# Patient Record
Sex: Male | Born: 1960 | Marital: Married | Smoking: Current some day smoker
Health system: Southern US, Community
[De-identification: ages and names within clinical notes are randomized; demographics above are authoritative.]

## PROBLEM LIST (undated history)

## (undated) DIAGNOSIS — E785 Hyperlipidemia, unspecified: Secondary | ICD-10-CM

## (undated) DIAGNOSIS — M109 Gout, unspecified: Secondary | ICD-10-CM

## (undated) HISTORY — PX: OTHER SURGICAL HISTORY: SHX169

## (undated) HISTORY — DX: Hyperlipidemia, unspecified: E78.5

## (undated) HISTORY — DX: Gout, unspecified: M10.9

---

## 2011-05-17 ENCOUNTER — Emergency Department: Payer: Self-pay | Admitting: Unknown Physician Specialty

## 2013-01-31 ENCOUNTER — Ambulatory Visit: Payer: Self-pay

## 2013-12-12 ENCOUNTER — Ambulatory Visit: Payer: Self-pay

## 2015-01-02 ENCOUNTER — Other Ambulatory Visit: Payer: Self-pay | Admitting: Neurosurgery

## 2015-01-06 ENCOUNTER — Other Ambulatory Visit: Payer: Self-pay | Admitting: Neurosurgery

## 2015-01-06 DIAGNOSIS — M5412 Radiculopathy, cervical region: Secondary | ICD-10-CM

## 2015-01-13 ENCOUNTER — Ambulatory Visit
Admission: RE | Admit: 2015-01-13 | Discharge: 2015-01-13 | Disposition: A | Discharge status: Home / Self Care | Payer: Self-pay | Source: Ambulatory Visit | Attending: Neurosurgery | Admitting: Neurosurgery

## 2015-01-13 ENCOUNTER — Ambulatory Visit
Admission: RE | Admit: 2015-01-13 | Discharge: 2015-01-13 | Disposition: A | Discharge status: Home / Self Care | Payer: Worker's Compensation | Source: Ambulatory Visit | Attending: Neurosurgery | Admitting: Neurosurgery

## 2015-01-13 DIAGNOSIS — M5412 Radiculopathy, cervical region: Secondary | ICD-10-CM

## 2015-01-13 MED ORDER — DIAZEPAM 5 MG PO TABS
5.0000 mg | ORAL_TABLET | Freq: Once | ORAL | Status: AC
  Administered 2015-01-13: 5 mg via ORAL

## 2015-01-13 MED ORDER — IOHEXOL 300 MG/ML  SOLN
10.0000 mL | Freq: Once | INTRAMUSCULAR | Status: DC | PRN
  Administered 2015-01-13: 10 mL via INTRATHECAL

## 2015-01-13 MED ORDER — MEPERIDINE HCL 100 MG/ML IJ SOLN
75.0000 mg | Freq: Once | INTRAMUSCULAR | Status: AC
  Administered 2015-01-13: 75 mg via INTRAMUSCULAR

## 2015-01-13 MED ORDER — ONDANSETRON HCL 4 MG/2ML IJ SOLN
4.0000 mg | Freq: Once | INTRAMUSCULAR | Status: AC
  Administered 2015-01-13: 4 mg via INTRAMUSCULAR

## 2015-01-13 NOTE — Discharge Instructions (Signed)

## 2015-12-24 IMAGING — RF DG MYELOGRAPHY LUMBAR INJ CERVICAL
8 series · 8 of 8 positions shown · non-contrast
Comparison: Cervical spine MRI from [REDACTED]
dated 03/07/2014

CLINICAL DATA: Cervical radiculopathy. Bilateral arm pain. Numbness
throughout the fingers bilaterally.
TECHNIQUE: Contiguous axial images were obtained through the Cervical spine
after the intrathecal infusion of contrast. Coronal and sagittal
reconstructions were obtained of the axial image sets.

[Series 1: (hospital) · 1 of 1 slices shown]
[im 1/1]
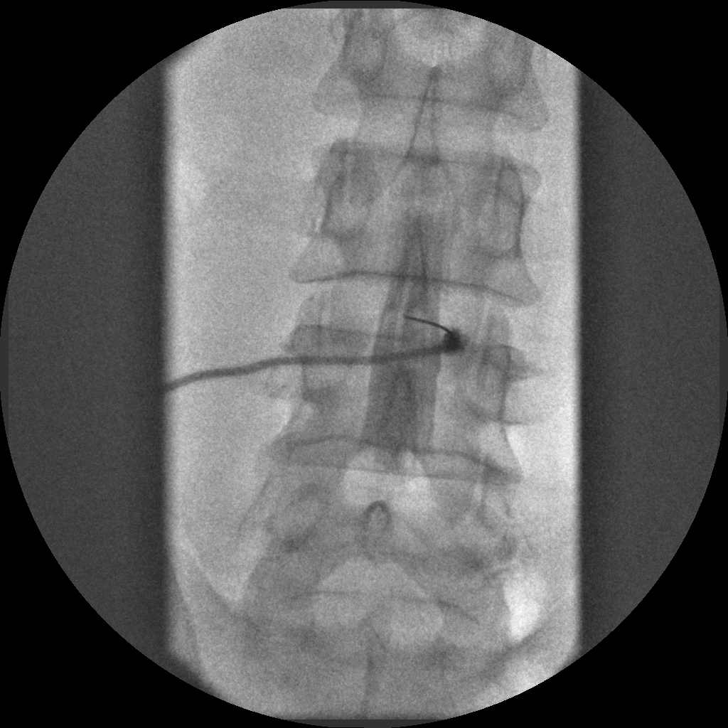

[Series 2: myelogram  white · 1 of 1 slices shown (1 of 7)]
[im 1/1]
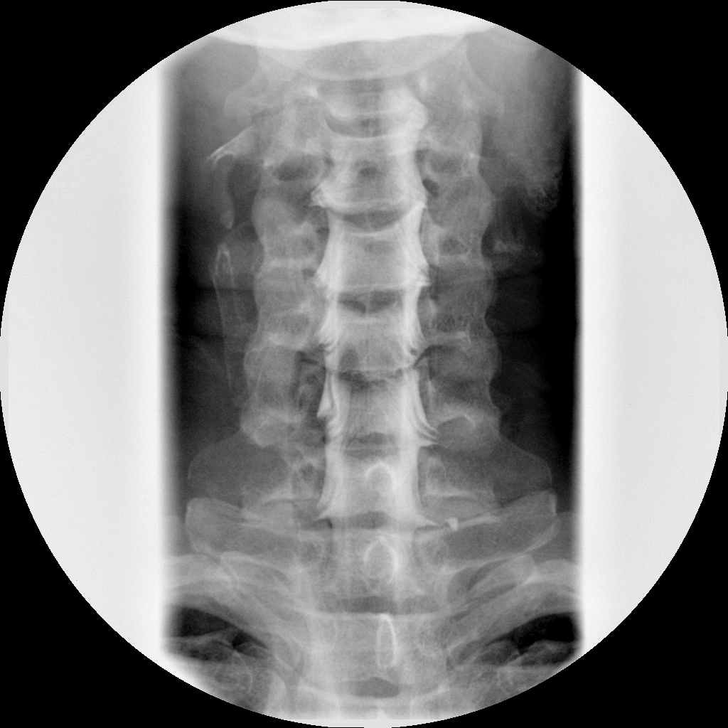

[Series 3: myelogram  white · 1 of 1 slices shown (2 of 7)]
[im 1/1]
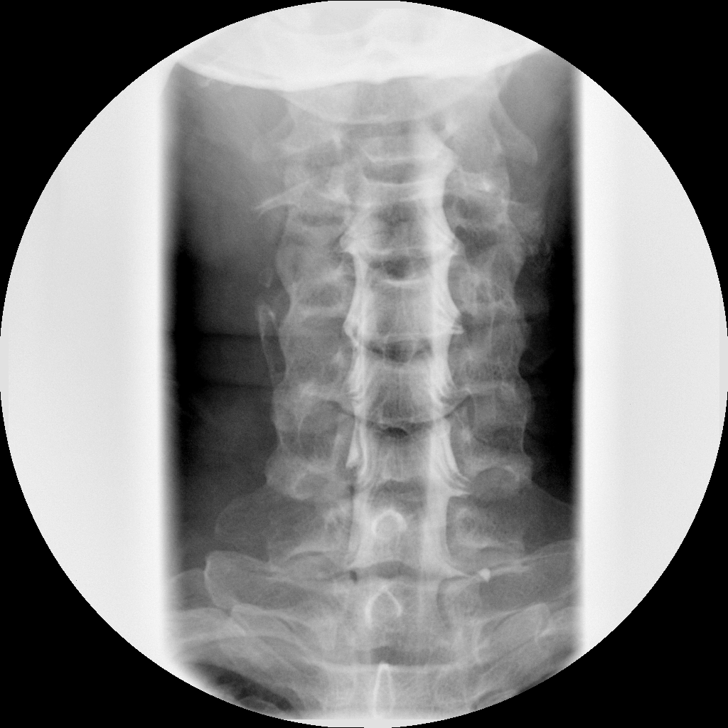

[Series 4: myelogram  white · 1 of 1 slices shown (3 of 7)]
[im 1/1]
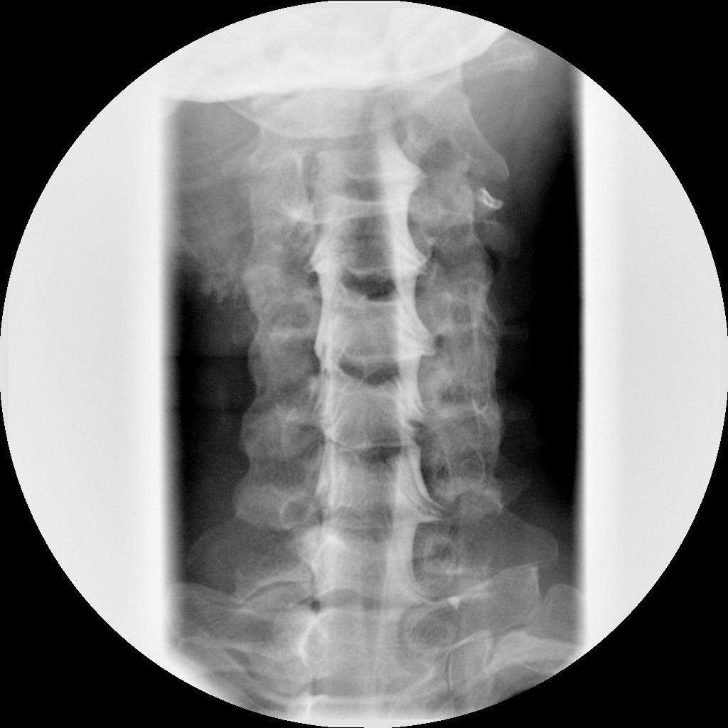

[Series 5: myelogram  white · 1 of 1 slices shown (4 of 7)]
[im 1/1]
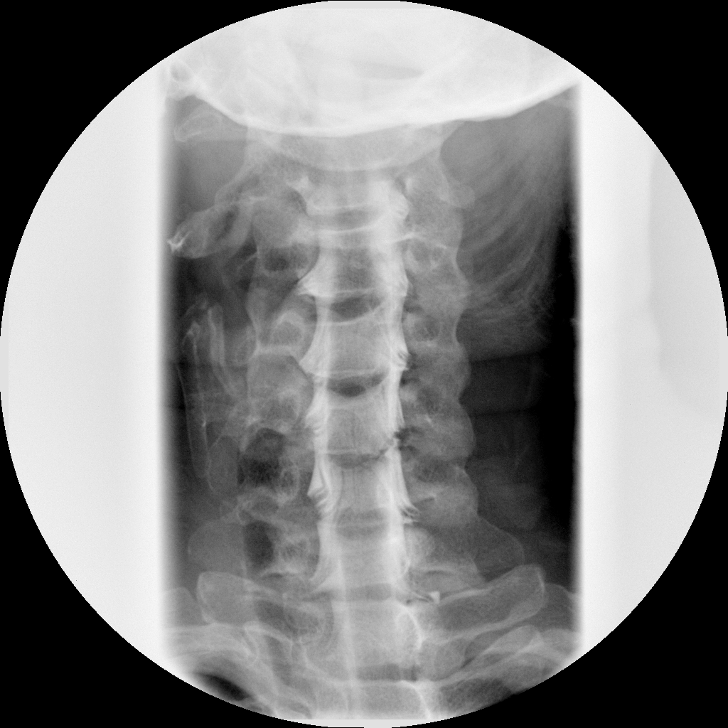

[Series 6: myelogram  white · 1 of 1 slices shown (5 of 7)]
[im 1/1]
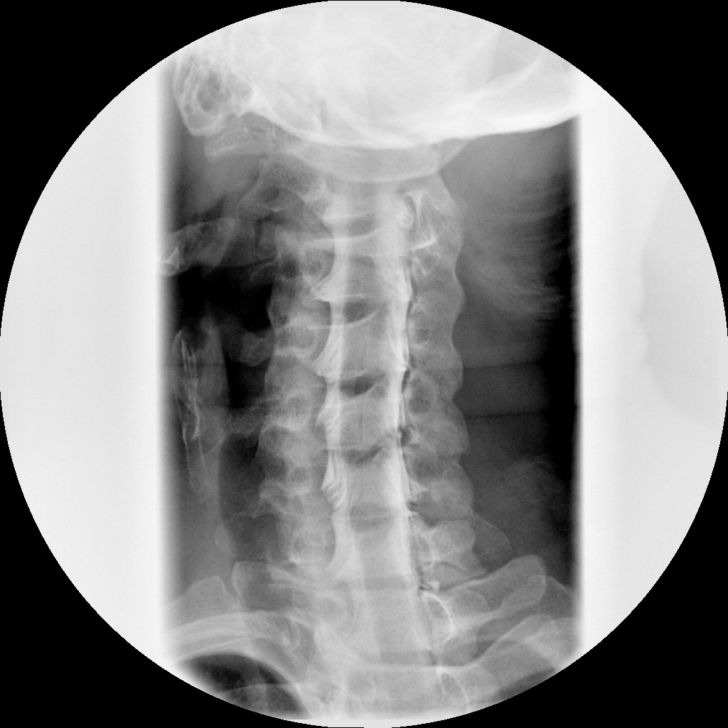

[Series 7: myelogram  white · 1 of 1 slices shown (6 of 7)]
[im 1/1]
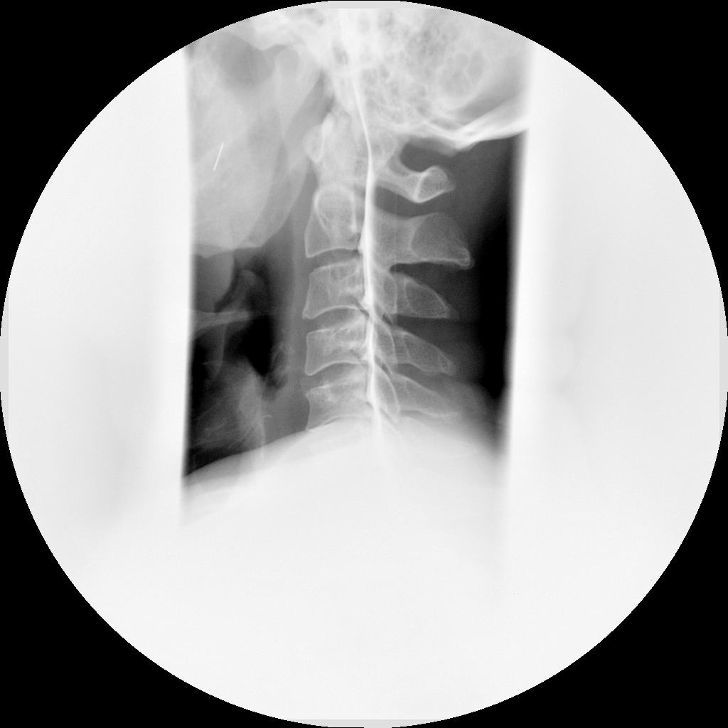

[Series 8: myelogram  white · 1 of 1 slices shown (7 of 7)]
[im 1/1]
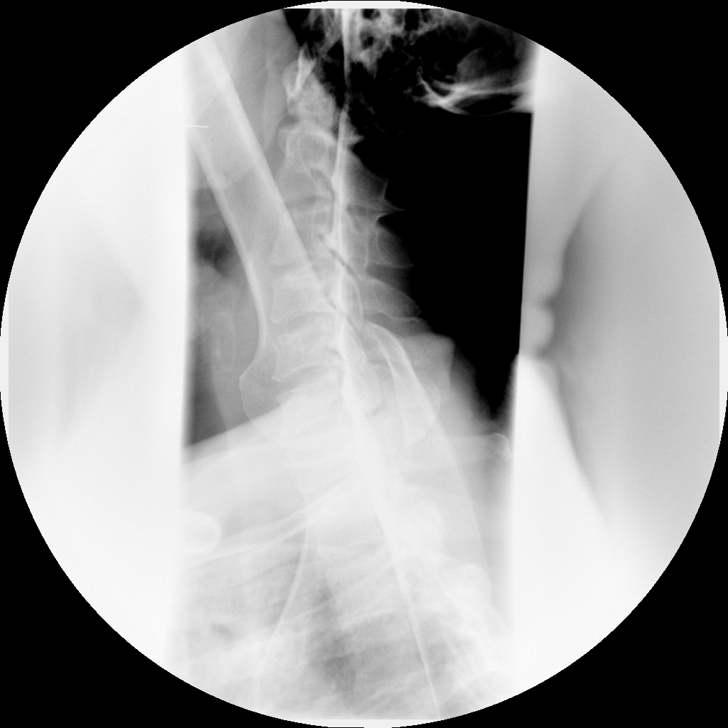

[8 of 8 positions shown; findings below may reference images not displayed]

FLUOROSCOPY TIME:  Fluoroscopy Time (in minutes and seconds): 0
minutes 38 seconds

Number of Acquired Images:  7

PROCEDURE:
LUMBAR PUNCTURE FOR CERVICAL MYELOGRAM

After thorough discussion of risks and benefits of the procedure
including bleeding, infection, injury to nerves, blood vessels,
adjacent structures as well as headache and CSF leak, written and
oral informed consent was obtained. Consent was obtained by Dr.
Thembza Marufu. We discussed the high likelihood of obtaining a
diagnostic study.

Patient was positioned prone on the fluoroscopy table. Local
anesthesia was provided with 1% lidocaine without epinephrine after
prepped and draped in the usual sterile fashion. Puncture was
performed at L3-4 using a 3 1/2 inch 22-gauge spinal needle via a
right paramedian approach. Using a single pass through the dura, the
needle was placed within the thecal sac, with return of clear CSF.
10 mL of Tmnipaque-WYY was injected into the thecal sac, with normal
opacification of the nerve roots and cauda equina consistent with
free flow within the subarachnoid space. The patient was then moved
to the trendelenburg position and contrast flowed into the Cervical
spine region.

I personally performed the lumbar puncture and administered the
intrathecal contrast. I also personally supervised acquisition of
the myelogram images.
FINDINGS: CERVICAL MYELOGRAM FINDINGS:

There is straightening of the normal cervical lordosis. There is no
listhesis. Mild disc space narrowing is present at C6-7 with
associated endplate spurring. Small ventral extradural defects are
present from C3-4 to C6-7. Nerve root sleeve cut off is greatest on
the left at C7 and bilaterally at C6 (right worse than left).

CT CERVICAL MYELOGRAM FINDINGS:

There is straightening of the normal cervical lordosis, unchanged.
There is no listhesis. Vertebral body heights are preserved. Mild
disc space narrowing is again seen at C6-7 with associated endplate
spurring. There is minimal disc space narrowing at C5-6 as well.
Cervical spinal cord is normal in caliber. Paraspinal soft tissues
are unremarkable.

C2-3:  Negative.

C3-4: Mild disc bulging and right uncovertebral spurring result in
mild right neural foraminal stenosis and borderline spinal stenosis,
unchanged.

C4-5: Mild disc bulging and uncovertebral spurring result in minimal
bilateral neural foraminal narrowing without spinal stenosis,
unchanged.

C5-6: Broad-based posterior disc osteophyte complex and
uncovertebral spurring result in mild bilateral neural foraminal
stenosis and mild spinal stenosis, unchanged.

C6-7: Broad-based posterior disc osteophyte complex and left greater
than right uncovertebral spurring result in minimal right and
moderate left neural foraminal stenosis and mild spinal stenosis,
unchanged. The left C7 nerve may be affected in the foramen.

C7-T1: Mild bilateral facet arthrosis and mild left uncovertebral
spurring result in mild left neural foraminal stenosis without
spinal stenosis, unchanged.
IMPRESSION: 1. Mild multilevel cervical disc degeneration, worst at C6-7 where
there is moderate left neural foraminal stenosis and mild spinal
stenosis.
2. Mild bilateral foraminal stenosis and mild spinal stenosis at
C5-6.

## 2023-02-08 ENCOUNTER — Encounter: Payer: Self-pay | Admitting: Internal Medicine

## 2023-02-08 ENCOUNTER — Ambulatory Visit: Payer: BC Managed Care – PPO | Admitting: Internal Medicine

## 2023-02-08 VITALS — BP 120/80 | HR 52 | Ht 65.0 in | Wt 174.8 lb

## 2023-02-08 DIAGNOSIS — M1000 Idiopathic gout, unspecified site: Secondary | ICD-10-CM | POA: Diagnosis not present

## 2023-02-08 DIAGNOSIS — Z125 Encounter for screening for malignant neoplasm of prostate: Secondary | ICD-10-CM | POA: Diagnosis not present

## 2023-02-08 DIAGNOSIS — E782 Mixed hyperlipidemia: Secondary | ICD-10-CM | POA: Diagnosis not present

## 2023-02-08 NOTE — Progress Notes (Signed)
Established Patient Office Visit  Subjective:  Patient ID: Cole Bender, male    DOB: March 07, 1961  Age: 62 y.o. MRN: [id number]  Chief Complaint  Patient presents with   Follow-up    1 year follow up    Patient not in since January 2022.  Says he was back in his home country and just returned to start back work.  He has been feeling well and was taking his medications while there along with regular blood work. Today he requests labs again. Has no new complaints.    No other concerns at this time.   History reviewed. No pertinent past medical history.  History reviewed. No pertinent surgical history.  Social History   Socioeconomic History   Marital status: Married    Spouse name: Not on file   Number of children: Not on file   Years of education: Not on file   Highest education level: Not on file  Occupational History   Not on file  Tobacco Use   Smoking status: Some Days   Smokeless tobacco: Not on file   Tobacco comments:    only when drinking  Substance and Sexual Activity   Alcohol use: Not on file   Drug use: Not on file   Sexual activity: Not on file  Other Topics Concern   Not on file  Social History Narrative   Not on file   Social Determinants of Health   Financial Resource Strain: Not on file  Food Insecurity: Not on file  Transportation Needs: Not on file  Physical Activity: Not on file  Stress: Not on file  Social Connections: Not on file  Intimate Partner Violence: Not on file    History reviewed. No pertinent family history.  No Known Allergies  Review of Systems  Constitutional: Negative.  Negative for chills, diaphoresis, fever and weight loss.  HENT: Negative.    Eyes: Negative.   Respiratory: Negative.  Negative for cough and shortness of breath.   Cardiovascular: Negative.  Negative for chest pain, palpitations and leg swelling.  Gastrointestinal: Negative.  Negative for abdominal pain, constipation, diarrhea, heartburn,  nausea and vomiting.  Genitourinary: Negative.  Negative for dysuria and flank pain.  Musculoskeletal: Negative.  Negative for joint pain and myalgias.  Skin: Negative.   Neurological: Negative.  Negative for dizziness and headaches.  Endo/Heme/Allergies: Negative.   Psychiatric/Behavioral: Negative.  Negative for depression and suicidal ideas. The patient is not nervous/anxious.        Objective:   BP 120/80   Pulse (!) 52   Ht 5\' 5"  (1.651 m)   Wt 174 lb 12.8 oz (79.3 kg)   SpO2 97%   BMI 29.09 kg/m   Vitals:   02/08/23 1012  BP: 120/80  Pulse: (!) 52  Height: 5\' 5"  (1.651 m)  Weight: 174 lb 12.8 oz (79.3 kg)  SpO2: 97%  BMI (Calculated): 29.09    Physical Exam Vitals and nursing note reviewed.  Constitutional:      Appearance: Normal appearance.  HENT:     Head: Normocephalic and atraumatic.     Nose: Nose normal.     Mouth/Throat:     Mouth: Mucous membranes are moist.     Pharynx: Oropharynx is clear.  Eyes:     Conjunctiva/sclera: Conjunctivae normal.     Pupils: Pupils are equal, round, and reactive to light.  Cardiovascular:     Rate and Rhythm: Normal rate and regular rhythm.     Pulses: Normal pulses.  Heart sounds: Normal heart sounds.  Pulmonary:     Effort: Pulmonary effort is normal.     Breath sounds: Normal breath sounds.  Abdominal:     General: Bowel sounds are normal.     Palpations: Abdomen is soft.  Musculoskeletal:        General: Normal range of motion.     Cervical back: Normal range of motion.  Skin:    General: Skin is warm and dry.  Neurological:     General: No focal deficit present.     Mental Status: He is alert and oriented to person, place, and time.  Psychiatric:        Mood and Affect: Mood normal.        Behavior: Behavior normal.        Judgment: Judgment normal.      No results found for any visits on 02/08/23.  No results found for this or any previous visit (from the past 2160 hour(s)).    Assessment  & Plan:  Check labs.  Continue meds.  To come in sooner if needed otherwise follow-up in 6 months. Problem List Items Addressed This Visit   None Visit Diagnoses     Mixed hyperlipidemia    -  Primary   Relevant Orders   CMP14+EGFR   Lipid Panel w/o Chol/HDL Ratio   Idiopathic gout, unspecified chronicity, unspecified site       Relevant Orders   CBC with Diff   Uric acid   Screening for prostate cancer       Relevant Orders   PSA       Return in about 6 months (around 08/08/2023).   Total time spent: 30 minutes  Margaretann Loveless, MD  02/08/2023   This document may have been prepared by [facility] Voice Recognition software and as such may include unintentional dictation errors.

## 2023-02-09 LAB — CBC WITH DIFFERENTIAL/PLATELET
Basophils Absolute: 0.1 10*3/uL (ref 0.0–0.2)
Basos: 1 %
EOS (ABSOLUTE): 0.3 10*3/uL (ref 0.0–0.4)
Eos: 4 %
Hematocrit: 46 % (ref 37.5–51.0)
Hemoglobin: 15.2 g/dL (ref 13.0–17.7)
Immature Grans (Abs): 0 10*3/uL (ref 0.0–0.1)
Immature Granulocytes: 0 %
Lymphocytes Absolute: 3.2 10*3/uL — ABNORMAL HIGH (ref 0.7–3.1)
Lymphs: 42 %
MCH: 31 pg (ref 26.6–33.0)
MCHC: 33 g/dL (ref 31.5–35.7)
MCV: 94 fL (ref 79–97)
Monocytes Absolute: 0.6 10*3/uL (ref 0.1–0.9)
Monocytes: 8 %
Neutrophils Absolute: 3.4 10*3/uL (ref 1.4–7.0)
Neutrophils: 45 %
Platelets: 319 10*3/uL (ref 150–450)
RBC: 4.91 x10E6/uL (ref 4.14–5.80)
RDW: 12.2 % (ref 11.6–15.4)
WBC: 7.5 10*3/uL (ref 3.4–10.8)

## 2023-02-09 LAB — CMP14+EGFR
ALT: 21 IU/L (ref 0–44)
AST: 27 IU/L (ref 0–40)
Albumin: 4.2 g/dL (ref 3.9–4.9)
Alkaline Phosphatase: 68 IU/L (ref 44–121)
BUN/Creatinine Ratio: 14 (ref 10–24)
BUN: 15 mg/dL (ref 8–27)
Bilirubin Total: 0.8 mg/dL (ref 0.0–1.2)
CO2: 24 mmol/L (ref 20–29)
Calcium: 9.8 mg/dL (ref 8.6–10.2)
Chloride: 100 mmol/L (ref 96–106)
Creatinine, Ser: 1.1 mg/dL (ref 0.76–1.27)
Globulin, Total: 3.3 g/dL (ref 1.5–4.5)
Glucose: 95 mg/dL (ref 70–99)
Potassium: 4.6 mmol/L (ref 3.5–5.2)
Sodium: 138 mmol/L (ref 134–144)
Total Protein: 7.5 g/dL (ref 6.0–8.5)
eGFR: 76 mL/min/{1.73_m2} (ref >59)

## 2023-02-09 LAB — LIPID PANEL W/O CHOL/HDL RATIO
Cholesterol, Total: 235 mg/dL — ABNORMAL HIGH (ref 100–199)
HDL: 43 mg/dL (ref >39)
LDL Chol Calc (NIH): 157 mg/dL — ABNORMAL HIGH (ref 0–99)
Triglycerides: 193 mg/dL — ABNORMAL HIGH (ref 0–149)
VLDL Cholesterol Cal: 35 mg/dL (ref 5–40)

## 2023-02-09 LAB — PSA: Prostate Specific Ag, Serum: 0.5 ng/mL (ref 0.0–4.0)

## 2023-02-09 LAB — URIC ACID: Uric Acid: 8.2 mg/dL (ref 3.8–8.4)

## 2023-02-10 ENCOUNTER — Other Ambulatory Visit: Payer: Self-pay | Admitting: Internal Medicine

## 2023-02-10 DIAGNOSIS — E782 Mixed hyperlipidemia: Secondary | ICD-10-CM

## 2023-02-10 DIAGNOSIS — E790 Hyperuricemia without signs of inflammatory arthritis and tophaceous disease: Secondary | ICD-10-CM

## 2023-02-10 MED ORDER — ROSUVASTATIN CALCIUM 20 MG PO TABS: 20.0000 mg | ORAL_TABLET | Freq: Every day | ORAL | 3 refills | Status: DC

## 2023-02-10 MED ORDER — ALLOPURINOL 300 MG PO TABS: 300.0000 mg | ORAL_TABLET | Freq: Every day | ORAL | 3 refills | Status: DC

## 2023-02-11 ENCOUNTER — Other Ambulatory Visit: Payer: Self-pay | Admitting: Internal Medicine

## 2023-02-11 DIAGNOSIS — E782 Mixed hyperlipidemia: Secondary | ICD-10-CM

## 2023-02-11 DIAGNOSIS — E790 Hyperuricemia without signs of inflammatory arthritis and tophaceous disease: Secondary | ICD-10-CM

## 2023-02-11 MED ORDER — ALLOPURINOL 300 MG PO TABS: 300.0000 mg | ORAL_TABLET | Freq: Every day | ORAL | 3 refills | Status: DC

## 2023-02-11 MED ORDER — ROSUVASTATIN CALCIUM 20 MG PO TABS: 20.0000 mg | ORAL_TABLET | Freq: Every day | ORAL | 3 refills | Status: DC

## 2023-08-08 ENCOUNTER — Ambulatory Visit: Payer: BC Managed Care – PPO | Admitting: Internal Medicine

## 2023-08-08 ENCOUNTER — Encounter: Payer: Self-pay | Admitting: Internal Medicine

## 2023-08-08 VITALS — BP 124/84 | HR 57 | Ht 65.0 in | Wt 179.6 lb

## 2023-08-08 DIAGNOSIS — Z1211 Encounter for screening for malignant neoplasm of colon: Secondary | ICD-10-CM | POA: Diagnosis not present

## 2023-08-08 DIAGNOSIS — E782 Mixed hyperlipidemia: Secondary | ICD-10-CM

## 2023-08-08 DIAGNOSIS — E790 Hyperuricemia without signs of inflammatory arthritis and tophaceous disease: Secondary | ICD-10-CM

## 2023-08-08 DIAGNOSIS — M1000 Idiopathic gout, unspecified site: Secondary | ICD-10-CM | POA: Diagnosis not present

## 2023-08-08 DIAGNOSIS — Z0130 Encounter for examination of blood pressure without abnormal findings: Secondary | ICD-10-CM

## 2023-08-08 NOTE — Progress Notes (Signed)
 Established Patient Office Visit  Subjective:  Patient ID: Cole Bender, male    DOB: [birth year]  Age: 63 y.o. MRN: [id number]  Chief Complaint  Patient presents with   Follow-up    6 month follow up    Patient is here for his 87-month follow-up.  He is generally feeling well and taking his medications.  He is fasting for blood work today.  Still needs to colon cancer screening, but would like to get a Cologuard at this time. Fasting for blood work.    No other concerns at this time.   History reviewed. No pertinent past medical history.  History reviewed. No pertinent surgical history.  Social History   Socioeconomic History   Marital status: Married    Spouse name: Not on file   Number of children: Not on file   Years of education: Not on file   Highest education level: Not on file  Occupational History   Not on file  Tobacco Use   Smoking status: Never   Smokeless tobacco: Never   Tobacco comments:    only when drinking  Substance and Sexual Activity   Alcohol use: Not on file   Drug use: Not on file   Sexual activity: Not on file  Other Topics Concern   Not on file  Social History Narrative   Not on file   Social Drivers of Health   Financial Resource Strain: Not on file  Food Insecurity: Not on file  Transportation Needs: Not on file  Physical Activity: Not on file  Stress: Not on file  Social Connections: Not on file  Intimate Partner Violence: Not on file    History reviewed. No pertinent family history.  No Known Allergies  Outpatient Medications Prior to Visit  Medication Sig   allopurinol (ZYLOPRIM) 300 MG tablet Take 1 tablet (300 mg total) by mouth daily. (Patient not taking: Reported on 08/08/2023)   rosuvastatin (CRESTOR) 20 MG tablet Take 1 tablet (20 mg total) by mouth daily. (Patient not taking: Reported on 08/08/2023)   No facility-administered medications prior to visit.    Review of Systems  Constitutional: Negative.   Negative for chills, fever, malaise/fatigue and weight loss.  HENT: Negative.  Negative for congestion and sore throat.   Eyes: Negative.   Respiratory: Negative.  Negative for cough and shortness of breath.   Cardiovascular: Negative.  Negative for chest pain, palpitations and leg swelling.  Gastrointestinal: Negative.  Negative for abdominal pain, blood in stool, constipation, diarrhea, heartburn, melena, nausea and vomiting.  Genitourinary: Negative.  Negative for dysuria and flank pain.  Musculoskeletal: Negative.  Negative for joint pain and myalgias.  Skin: Negative.   Neurological: Negative.  Negative for dizziness, tingling, tremors and headaches.  Endo/Heme/Allergies: Negative.   Psychiatric/Behavioral: Negative.  Negative for depression and suicidal ideas. The patient is not nervous/anxious.        Objective:   BP 124/84   Pulse (!) 57   Ht 5\' 5"  (1.651 m)   Wt 179 lb 9.6 oz (81.5 kg)   SpO2 98%   BMI 29.89 kg/m   Vitals:   08/08/23 0900  BP: 124/84  Pulse: (!) 57  Height: 5\' 5"  (1.651 m)  Weight: 179 lb 9.6 oz (81.5 kg)  SpO2: 98%  BMI (Calculated): 29.89    Physical Exam Vitals and nursing note reviewed.  Constitutional:      Appearance: Normal appearance.  HENT:     Head: Normocephalic and atraumatic.     Nose:  Nose normal.     Mouth/Throat:     Mouth: Mucous membranes are moist.     Pharynx: Oropharynx is clear.  Eyes:     Conjunctiva/sclera: Conjunctivae normal.     Pupils: Pupils are equal, round, and reactive to light.  Cardiovascular:     Rate and Rhythm: Normal rate and regular rhythm.     Pulses: Normal pulses.     Heart sounds: Normal heart sounds.  Pulmonary:     Effort: Pulmonary effort is normal.     Breath sounds: Normal breath sounds.  Abdominal:     General: Bowel sounds are normal.     Palpations: Abdomen is soft.  Musculoskeletal:        General: Normal range of motion.     Cervical back: Normal range of motion.  Skin:     General: Skin is warm and dry.  Neurological:     General: No focal deficit present.     Mental Status: He is alert and oriented to person, place, and time.  Psychiatric:        Mood and Affect: Mood normal.        Behavior: Behavior normal.        Judgment: Judgment normal.      No results found for any visits on 08/08/23.  No results found for this or any previous visit (from the past 2160 hours).    Assessment & Plan:  Continue current medications, diet and exercise regimen.  Set up Cologuard.  Check blood work today. Problem List Items Addressed This Visit     Idiopathic gout   Relevant Orders   CBC with Diff   Mixed hyperlipidemia - Primary   Relevant Orders   CMP14+EGFR   Lipid Panel w/o Chol/HDL Ratio   Uric acid   Other Visit Diagnoses       Colon cancer screening       Relevant Orders   Cologuard       Return in about 6 months (around 02/08/2024).   Total time spent: 30 minutes  Margaretann Loveless, MD  08/08/2023   This document may have been prepared by [facility] Voice Recognition software and as such may include unintentional dictation errors.

## 2023-08-09 ENCOUNTER — Other Ambulatory Visit: Payer: Self-pay | Admitting: Internal Medicine

## 2023-08-09 DIAGNOSIS — E782 Mixed hyperlipidemia: Secondary | ICD-10-CM

## 2023-08-09 DIAGNOSIS — E790 Hyperuricemia without signs of inflammatory arthritis and tophaceous disease: Secondary | ICD-10-CM

## 2023-08-09 LAB — CMP14+EGFR
ALT: 27 IU/L (ref 0–44)
AST: 20 IU/L (ref 0–40)
Albumin: 4.4 g/dL (ref 3.9–4.9)
Alkaline Phosphatase: 77 IU/L (ref 44–121)
BUN/Creatinine Ratio: 12 (ref 10–24)
BUN: 14 mg/dL (ref 8–27)
Bilirubin Total: 0.8 mg/dL (ref 0.0–1.2)
CO2: 24 mmol/L (ref 20–29)
Calcium: 9.6 mg/dL (ref 8.6–10.2)
Chloride: 102 mmol/L (ref 96–106)
Creatinine, Ser: 1.14 mg/dL (ref 0.76–1.27)
Globulin, Total: 3 g/dL (ref 1.5–4.5)
Glucose: 100 mg/dL — ABNORMAL HIGH (ref 70–99)
Potassium: 4.5 mmol/L (ref 3.5–5.2)
Sodium: 139 mmol/L (ref 134–144)
Total Protein: 7.4 g/dL (ref 6.0–8.5)
eGFR: 73 mL/min/{1.73_m2} (ref >59)

## 2023-08-09 LAB — LIPID PANEL W/O CHOL/HDL RATIO
Cholesterol, Total: 230 mg/dL — ABNORMAL HIGH (ref 100–199)
HDL: 45 mg/dL (ref >39)
LDL Chol Calc (NIH): 154 mg/dL — ABNORMAL HIGH (ref 0–99)
Triglycerides: 169 mg/dL — ABNORMAL HIGH (ref 0–149)
VLDL Cholesterol Cal: 31 mg/dL (ref 5–40)

## 2023-08-09 LAB — CBC WITH DIFFERENTIAL/PLATELET
Basophils Absolute: 0.1 10*3/uL (ref 0.0–0.2)
Basos: 1 %
EOS (ABSOLUTE): 0.2 10*3/uL (ref 0.0–0.4)
Eos: 3 %
Hematocrit: 48.7 % (ref 37.5–51.0)
Hemoglobin: 15.5 g/dL (ref 13.0–17.7)
Immature Grans (Abs): 0 10*3/uL (ref 0.0–0.1)
Immature Granulocytes: 0 %
Lymphocytes Absolute: 3.4 10*3/uL — ABNORMAL HIGH (ref 0.7–3.1)
Lymphs: 49 %
MCH: 30.2 pg (ref 26.6–33.0)
MCHC: 31.8 g/dL (ref 31.5–35.7)
MCV: 95 fL (ref 79–97)
Monocytes Absolute: 0.6 10*3/uL (ref 0.1–0.9)
Monocytes: 8 %
Neutrophils Absolute: 2.7 10*3/uL (ref 1.4–7.0)
Neutrophils: 39 %
Platelets: 348 10*3/uL (ref 150–450)
RBC: 5.14 x10E6/uL (ref 4.14–5.80)
RDW: 11.9 % (ref 11.6–15.4)
WBC: 7 10*3/uL (ref 3.4–10.8)

## 2023-08-09 LAB — URIC ACID: Uric Acid: 7.7 mg/dL (ref 3.8–8.4)

## 2023-08-09 MED ORDER — ROSUVASTATIN CALCIUM 40 MG PO TABS: 40.0000 mg | ORAL_TABLET | Freq: Every day | ORAL | 3 refills | Status: DC

## 2023-08-09 MED ORDER — ALLOPURINOL 300 MG PO TABS: 300.0000 mg | ORAL_TABLET | Freq: Every day | ORAL | 3 refills | Status: AC

## 2023-08-10 NOTE — Progress Notes (Signed)
 Patient notified

## 2023-09-06 LAB — COLOGUARD: COLOGUARD: NEGATIVE

## 2023-11-07 ENCOUNTER — Encounter: Payer: Self-pay | Admitting: Internal Medicine

## 2023-11-07 ENCOUNTER — Ambulatory Visit: Admitting: Internal Medicine

## 2023-11-07 VITALS — BP 108/78 | HR 57 | Ht 65.0 in | Wt 179.8 lb

## 2023-11-07 DIAGNOSIS — E790 Hyperuricemia without signs of inflammatory arthritis and tophaceous disease: Secondary | ICD-10-CM | POA: Diagnosis not present

## 2023-11-07 DIAGNOSIS — Z0130 Encounter for examination of blood pressure without abnormal findings: Secondary | ICD-10-CM

## 2023-11-07 DIAGNOSIS — E782 Mixed hyperlipidemia: Secondary | ICD-10-CM

## 2023-11-07 MED ORDER — ROSUVASTATIN CALCIUM 40 MG PO TABS: 40.0000 mg | ORAL_TABLET | Freq: Every day | ORAL | 3 refills | Status: AC

## 2023-11-07 NOTE — Progress Notes (Signed)
 Established Patient Office Visit  Subjective:  Patient ID: Cole Bender, male    DOB: 04/06/1961  Age: 63 y.o. MRN: [id number]  Chief Complaint  Patient presents with   Follow-up    3 month follow up    Patient comes in for his follow-up today.  He is generally feeling well and has no new complaints.  His recent labs were 3 months ago which showed a very high LDL as well as uric acid.  His medications were adjusted.  He is fasting today for blood work.  He has no, complaints of chest pain or shortness of breath.  Also not having any joint pains.    No other concerns at this time.   History reviewed. No pertinent past medical history.  History reviewed. No pertinent surgical history.  Social History   Socioeconomic History   Marital status: Married    Spouse name: Not on file   Number of children: Not on file   Years of education: Not on file   Highest education level: Not on file  Occupational History   Not on file  Tobacco Use   Smoking status: Never   Smokeless tobacco: Never   Tobacco comments:    only when drinking  Substance and Sexual Activity   Alcohol use: Not on file   Drug use: Not on file   Sexual activity: Not on file  Other Topics Concern   Not on file  Social History Narrative   Not on file   Social Drivers of Health   Financial Resource Strain: Not on file  Food Insecurity: Not on file  Transportation Needs: Not on file  Physical Activity: Not on file  Stress: Not on file  Social Connections: Not on file  Intimate Partner Violence: Not on file    History reviewed. No pertinent family history.  No Known Allergies  Outpatient Medications Prior to Visit  Medication Sig   allopurinol  (ZYLOPRIM ) 300 MG tablet Take 1 tablet (300 mg total) by mouth daily.   [DISCONTINUED] rosuvastatin  (CRESTOR ) 40 MG tablet Take 1 tablet (40 mg total) by mouth daily.   No facility-administered medications prior to visit.    Review of Systems   Constitutional: Negative.  Negative for chills, fever, malaise/fatigue and weight loss.  HENT: Negative.  Negative for ear pain and sore throat.   Eyes: Negative.   Respiratory: Negative.  Negative for cough and shortness of breath.   Cardiovascular: Negative.  Negative for chest pain, palpitations and leg swelling.  Gastrointestinal: Negative.  Negative for abdominal pain, constipation, diarrhea, heartburn, nausea and vomiting.  Genitourinary: Negative.  Negative for dysuria and flank pain.  Musculoskeletal: Negative.  Negative for back pain, joint pain, myalgias and neck pain.  Skin: Negative.   Neurological: Negative.  Negative for dizziness, tingling, tremors, sensory change and headaches.  Endo/Heme/Allergies: Negative.   Psychiatric/Behavioral: Negative.  Negative for depression and suicidal ideas. The patient is not nervous/anxious.        Objective:   BP 108/78   Pulse (!) 57   Ht 5' 5 (1.651 m)   Wt 179 lb 12.8 oz (81.6 kg)   SpO2 98%   BMI 29.92 kg/m   Vitals:   11/07/23 0923  BP: 108/78  Pulse: (!) 57  Height: 5' 5 (1.651 m)  Weight: 179 lb 12.8 oz (81.6 kg)  SpO2: 98%  BMI (Calculated): 29.92    Physical Exam Vitals and nursing note reviewed.  Constitutional:      Appearance: Normal appearance.  HENT:     Head: Normocephalic and atraumatic.     Nose: Nose normal.     Mouth/Throat:     Mouth: Mucous membranes are moist.     Pharynx: Oropharynx is clear.   Eyes:     Conjunctiva/sclera: Conjunctivae normal.     Pupils: Pupils are equal, round, and reactive to light.    Cardiovascular:     Rate and Rhythm: Normal rate and regular rhythm.     Pulses: Normal pulses.     Heart sounds: Normal heart sounds.  Pulmonary:     Effort: Pulmonary effort is normal.     Breath sounds: Normal breath sounds.  Abdominal:     General: Bowel sounds are normal.     Palpations: Abdomen is soft.   Musculoskeletal:        General: Normal range of motion.      Cervical back: Normal range of motion.   Skin:    General: Skin is warm and dry.   Neurological:     General: No focal deficit present.     Mental Status: He is alert and oriented to person, place, and time.   Psychiatric:        Mood and Affect: Mood normal.        Behavior: Behavior normal.        Judgment: Judgment normal.      No results found for any visits on 11/07/23.  Recent Results (from the past 2160 hours)  Cologuard     Status: None   Collection Time: 08/29/23  8:28 AM  Result Value Ref Range   COLOGUARD Negative Negative    Comment: The Cologuard (TM) test was performed on this specimen.  NEGATIVE TEST RESULT. A negative Cologuard result indicates a low likelihood that a colorectal cancer (CRC) or advanced adenoma (adenomatous polyps with more advanced pre-malignant features) is present. The chance that a person with a negative Cologuard test has a colorectal cancer is less than 1 in 1500 (negative predictive value >99.9%) or has an advanced adenoma is less than 5.3% (negative predictive value 94.7%). These data are based on a prospective cross-sectional study of 10,000 individuals at average risk for colorectal cancer who were screened with both Cologuard and colonoscopy. (Imperiale T. et al, N Engl J Med 2014;370(14):1286-1297) The normal value (reference range) for this assay is negative.  COLOGUARD RE-SCREENING RECOMMENDATION: Periodic colorectal cancer screening is an important part of preventive healthcare for asymptomatic individuals at average risk for colorectal cancer. Following a negative Cologuard  result, the American Cancer Society and U.S. Multi-Society Task Force screening guidelines recommend a Cologuard re-screening interval of 3 years.  References: American Cancer Society Guideline for Colorectal Cancer Screening: https://www.cancer.org/cancer/colon-rectal-cancer/detection-diagnosis-staging/acs-recommendations.html.; Rex DK, Boland CR, Dominitz JK,  Colorectal Cancer Screening: Recommendations for Physicians and Patients from the U.S. Multi-Society Task Force on Colorectal Cancer Screening , Am J Gastroenterology 2017; 112:1016-1030.  TEST DESCRIPTION: Composite algorithmic analysis of stool DNA-biomarkers with hemoglobin immunoassay.   Quantitative values of individual biomarkers are not reportable and are not associated with individual biomarker result reference ranges. Cologuard is intended for colorectal cancer screening of adults of either sex, 45 years or older, who are at average-risk for colorectal cancer (CRC). Cologuard has been approved for use by the U.[place].  FDA. The performance of Cologuard was established in a cross sectional study of average-risk adults aged 81-84. Cologuard performance in patients ages 73 to 25 years was estimated by sub-group analysis of near-age groups. Colonoscopies performed for a positive  result may find as the most clinically significant lesion: colorectal cancer [4.0%], advanced adenoma (including sessile serrated polyps greater than or equal to 1cm diameter) [20%] or non- advanced adenoma [31%]; or no colorectal neoplasia [45%]. These estimates are derived from a prospective cross-sectional screening study of 10,000 individuals at average risk for colorectal cancer who were screened with both Cologuard and colonoscopy. (Imperiale T. et al, LOISE [place] J Med 2014;370(14):1286-1297.) Cologuard may produce a false negative or false positive result (no colorectal cancer or precancerous polyp present at colonoscopy follow up). A negative Cologuard test result does not guarantee the absence of CRC or advanced adenoma  (pre-cancer). The current Cologuard screening interval is every 3 years. Science writer and [place] Therapist, music). Cologuard performance data in a 10,000 patient pivotal study using colonoscopy as the reference method can be accessed at the following location: www.exactlabs.com/results. Additional  description of the Cologuard test process, warnings and precautions can be found at www.cologuard.com.       Assessment & Plan:  Check labs today.  Continue medications. Problem List Items Addressed This Visit     Mixed hyperlipidemia   Relevant Medications   rosuvastatin  (CRESTOR ) 40 MG tablet   Other Relevant Orders   Lipid Panel w/o Chol/HDL Ratio   CMP14+EGFR   Other Visit Diagnoses       Hyperuricemia    -  Primary   Relevant Orders   Uric acid   CBC with Diff       Return in about 6 months (around 05/08/2024).   Total time spent: 30 minutes  FERNAND FREDY RAMAN, MD  11/07/2023   This document may have been prepared by [facility] Voice Recognition software and as such may include unintentional dictation errors.

## 2023-11-08 ENCOUNTER — Ambulatory Visit: Payer: Self-pay | Admitting: Internal Medicine

## 2023-11-08 LAB — LIPID PANEL W/O CHOL/HDL RATIO
Cholesterol, Total: 142 mg/dL (ref 100–199)
HDL: 48 mg/dL (ref >39)
LDL Chol Calc (NIH): 77 mg/dL (ref 0–99)
Triglycerides: 88 mg/dL (ref 0–149)
VLDL Cholesterol Cal: 17 mg/dL (ref 5–40)

## 2023-11-08 LAB — CMP14+EGFR
ALT: 46 IU/L — ABNORMAL HIGH (ref 0–44)
AST: 30 IU/L (ref 0–40)
Albumin: 4.1 g/dL (ref 3.9–4.9)
Alkaline Phosphatase: 69 IU/L (ref 44–121)
BUN/Creatinine Ratio: 13 (ref 10–24)
BUN: 15 mg/dL (ref 8–27)
Bilirubin Total: 0.4 mg/dL (ref 0.0–1.2)
CO2: 23 mmol/L (ref 20–29)
Calcium: 9.2 mg/dL (ref 8.6–10.2)
Chloride: 105 mmol/L (ref 96–106)
Creatinine, Ser: 1.16 mg/dL (ref 0.76–1.27)
Globulin, Total: 2.9 g/dL (ref 1.5–4.5)
Glucose: 93 mg/dL (ref 70–99)
Potassium: 4.4 mmol/L (ref 3.5–5.2)
Sodium: 140 mmol/L (ref 134–144)
Total Protein: 7 g/dL (ref 6.0–8.5)
eGFR: 71 mL/min/{1.73_m2} (ref >59)

## 2023-11-08 LAB — CBC WITH DIFFERENTIAL/PLATELET
Basophils Absolute: 0.1 10*3/uL (ref 0.0–0.2)
Basos: 1 %
EOS (ABSOLUTE): 0.3 10*3/uL (ref 0.0–0.4)
Eos: 4 %
Hematocrit: 44.9 % (ref 37.5–51.0)
Hemoglobin: 14.1 g/dL (ref 13.0–17.7)
Immature Grans (Abs): 0 10*3/uL (ref 0.0–0.1)
Immature Granulocytes: 0 %
Lymphocytes Absolute: 3.3 10*3/uL — ABNORMAL HIGH (ref 0.7–3.1)
Lymphs: 51 %
MCH: 30.5 pg (ref 26.6–33.0)
MCHC: 31.4 g/dL — ABNORMAL LOW (ref 31.5–35.7)
MCV: 97 fL (ref 79–97)
Monocytes Absolute: 0.5 10*3/uL (ref 0.1–0.9)
Monocytes: 8 %
Neutrophils Absolute: 2.4 10*3/uL (ref 1.4–7.0)
Neutrophils: 36 %
Platelets: 289 10*3/uL (ref 150–450)
RBC: 4.63 x10E6/uL (ref 4.14–5.80)
RDW: 12.1 % (ref 11.6–15.4)
WBC: 6.6 10*3/uL (ref 3.4–10.8)

## 2023-11-08 LAB — URIC ACID: Uric Acid: 5 mg/dL (ref 3.8–8.4)

## 2023-11-09 NOTE — Progress Notes (Signed)
 Patient notified

## 2024-02-09 ENCOUNTER — Ambulatory Visit: Admitting: Internal Medicine

## 2024-05-08 ENCOUNTER — Ambulatory Visit: Admitting: Internal Medicine

## 2024-05-08 ENCOUNTER — Encounter: Payer: Self-pay | Admitting: Internal Medicine

## 2024-05-08 VITALS — BP 122/84 | HR 60 | Ht 65.0 in | Wt 183.4 lb

## 2024-05-08 DIAGNOSIS — E66811 Obesity, class 1: Secondary | ICD-10-CM

## 2024-05-08 DIAGNOSIS — E6609 Other obesity due to excess calories: Secondary | ICD-10-CM | POA: Diagnosis not present

## 2024-05-08 DIAGNOSIS — Z0130 Encounter for examination of blood pressure without abnormal findings: Secondary | ICD-10-CM

## 2024-05-08 DIAGNOSIS — Z125 Encounter for screening for malignant neoplasm of prostate: Secondary | ICD-10-CM

## 2024-05-08 DIAGNOSIS — E782 Mixed hyperlipidemia: Secondary | ICD-10-CM | POA: Diagnosis not present

## 2024-05-08 DIAGNOSIS — E790 Hyperuricemia without signs of inflammatory arthritis and tophaceous disease: Secondary | ICD-10-CM

## 2024-05-08 DIAGNOSIS — Z6830 Body mass index (BMI) 30.0-30.9, adult: Secondary | ICD-10-CM | POA: Diagnosis not present

## 2024-05-08 NOTE — Progress Notes (Signed)
 "  Established Patient Office Visit  Subjective:  Patient ID: Cole Bender, male    DOB: [birth year]  Age: 63 y.o. MRN: [id number]  Chief Complaint  Patient presents with   Follow-up    6 month follow up    Patient is here today for follow up. He reports feeling well and has no new concerns at this time.  Patient states he is taking his medications as prescribed and is due to go pick up his cholesterol refill.  Patient is fasting and will get routine blood work collected today including annual PSA.  He is up to date on colon cancer screening. He completed cologuard 08/2023 that was negative.    No other concerns at this time.   History reviewed. No pertinent past medical history.  History reviewed. No pertinent surgical history.  Social History   Socioeconomic History   Marital status: Married    Spouse name: Not on file   Number of children: Not on file   Years of education: Not on file   Highest education level: Not on file  Occupational History   Not on file  Tobacco Use   Smoking status: Never   Smokeless tobacco: Never   Tobacco comments:    only when drinking  Substance and Sexual Activity   Alcohol use: Not on file   Drug use: Not on file   Sexual activity: Not on file  Other Topics Concern   Not on file  Social History Narrative   Not on file   Social Drivers of Health   Tobacco Use: Low Risk (05/08/2024)   Patient History    Smoking Tobacco Use: Never    Smokeless Tobacco Use: Never    Passive Exposure: Not on file  Financial Resource Strain: Not on file  Food Insecurity: Not on file  Transportation Needs: Not on file  Physical Activity: Not on file  Stress: Not on file  Social Connections: Not on file  Intimate Partner Violence: Not on file  Depression (EYV7-0): Low Risk (08/08/2023)   Depression (PHQ2-9)    PHQ-2 Score: 0  Alcohol Screen: Not on file  Housing: Not on file  Utilities: Not on file  Health Literacy: Not on file     History reviewed. No pertinent family history.  Allergies[1]  Show/hide medication list[2]  Review of Systems  Constitutional: Negative.  Negative for chills, fever and malaise/fatigue.  HENT: Negative.  Negative for congestion and sore throat.   Eyes: Negative.  Negative for blurred vision and pain.  Respiratory: Negative.  Negative for cough and shortness of breath.   Cardiovascular: Negative.  Negative for chest pain, palpitations and leg swelling.  Gastrointestinal: Negative.  Negative for abdominal pain, blood in stool, constipation, diarrhea, heartburn, melena, nausea and vomiting.  Genitourinary: Negative.  Negative for dysuria, flank pain, frequency and urgency.  Musculoskeletal: Negative.  Negative for joint pain and myalgias.  Skin: Negative.   Neurological: Negative.  Negative for dizziness, tingling, sensory change, weakness and headaches.  Endo/Heme/Allergies: Negative.   Psychiatric/Behavioral: Negative.  Negative for depression and suicidal ideas. The patient is not nervous/anxious.        Objective:   BP 122/84   Pulse 60   Ht 5' 5 (1.651 m)   Wt 183 lb 6.4 oz (83.2 kg)   SpO2 98%   BMI 30.52 kg/m   Vitals:   05/08/24 0907  BP: 122/84  Pulse: 60  Height: 5' 5 (1.651 m)  Weight: 183 lb 6.4 oz (83.2 kg)  SpO2: 98%  BMI (Calculated): 30.52    Physical Exam Vitals and nursing note reviewed.  Constitutional:      General: He is not in acute distress.    Appearance: Normal appearance. He is not ill-appearing.  HENT:     Head: Normocephalic and atraumatic.     Nose: Nose normal.     Mouth/Throat:     Mouth: Mucous membranes are moist.     Pharynx: Oropharynx is clear.  Eyes:     Conjunctiva/sclera: Conjunctivae normal.     Pupils: Pupils are equal, round, and reactive to light.  Cardiovascular:     Rate and Rhythm: Normal rate and regular rhythm.     Pulses: Normal pulses.     Heart sounds: Normal heart sounds.  Pulmonary:     Effort:  Pulmonary effort is normal.     Breath sounds: Normal breath sounds. No wheezing or rhonchi.  Abdominal:     General: Bowel sounds are normal. There is no distension.     Palpations: Abdomen is soft.     Tenderness: There is no abdominal tenderness.  Musculoskeletal:        General: Normal range of motion.     Cervical back: Normal range of motion and neck supple.     Right lower leg: No edema.     Left lower leg: No edema.  Skin:    General: Skin is warm and dry.     Capillary Refill: Capillary refill takes less than 2 seconds.  Neurological:     General: No focal deficit present.     Mental Status: He is alert and oriented to person, place, and time.     Sensory: No sensory deficit.     Motor: No weakness.  Psychiatric:        Mood and Affect: Mood normal.        Behavior: Behavior normal.        Judgment: Judgment normal.      No results found for any visits on 05/08/24.  No results found for this or any previous visit (from the past 2160 hours).    Assessment & Plan:  Continue taking medications as prescribed. Check routine blood work today and FU with patient on results. Reinforced healthy diet and exercise as tolerated to improve cholesterol and promote weight loss. Problem List Items Addressed This Visit       Other   Mixed hyperlipidemia   Relevant Orders   CMP14+EGFR   Lipid Panel w/o Chol/HDL Ratio   Hyperuricemia   Relevant Orders   Uric acid   CBC with Diff   Prostate cancer screening - Primary   Relevant Orders   PSA   Class 1 obesity due to excess calories with serious comorbidity and body mass index (BMI) of 30.0 to 30.9 in adult    Return in about 3 months (around 08/06/2024).   Total time spent: 20 minutes. This time includes review of previous notes and results and patient face to face interaction during today's visit.    FERNAND FREDY RAMAN, MD  05/08/2024   This document may have been prepared by [facility] Voice Recognition software and as such  may include unintentional dictation errors.     [1] No Known Allergies [2]  Outpatient Medications Prior to Visit  Medication Sig   allopurinol  (ZYLOPRIM ) 300 MG tablet Take 1 tablet (300 mg total) by mouth daily.   rosuvastatin  (CRESTOR ) 40 MG tablet Take 1 tablet (40 mg total) by mouth daily.  No facility-administered medications prior to visit.   "

## 2024-05-09 LAB — CMP14+EGFR
ALT: 37 IU/L (ref 0–44)
AST: 26 IU/L (ref 0–40)
Albumin: 4.3 g/dL (ref 3.9–4.9)
Alkaline Phosphatase: 78 IU/L (ref 47–123)
BUN/Creatinine Ratio: 16 (ref 10–24)
BUN: 17 mg/dL (ref 8–27)
Bilirubin Total: 0.9 mg/dL (ref 0.0–1.2)
CO2: 25 mmol/L (ref 20–29)
Calcium: 9.3 mg/dL (ref 8.6–10.2)
Chloride: 104 mmol/L (ref 96–106)
Creatinine, Ser: 1.08 mg/dL (ref 0.76–1.27)
Globulin, Total: 2.8 g/dL (ref 1.5–4.5)
Glucose: 106 mg/dL — ABNORMAL HIGH (ref 70–99)
Potassium: 4.2 mmol/L (ref 3.5–5.2)
Sodium: 140 mmol/L (ref 134–144)
Total Protein: 7.1 g/dL (ref 6.0–8.5)
eGFR: 77 mL/min/1.73

## 2024-05-09 LAB — LIPID PANEL W/O CHOL/HDL RATIO
Cholesterol, Total: 115 mg/dL (ref 100–199)
HDL: 45 mg/dL
LDL Chol Calc (NIH): 45 mg/dL (ref 0–99)
Triglycerides: 148 mg/dL (ref 0–149)
VLDL Cholesterol Cal: 25 mg/dL (ref 5–40)

## 2024-05-09 LAB — CBC WITH DIFFERENTIAL/PLATELET
Basophils Absolute: 0.1 x10E3/uL (ref 0.0–0.2)
Basos: 1 %
EOS (ABSOLUTE): 0.2 x10E3/uL (ref 0.0–0.4)
Eos: 3 %
Hematocrit: 47.1 % (ref 37.5–51.0)
Hemoglobin: 14.2 g/dL (ref 13.0–17.7)
Immature Grans (Abs): 0 x10E3/uL (ref 0.0–0.1)
Immature Granulocytes: 0 %
Lymphocytes Absolute: 3.4 x10E3/uL — ABNORMAL HIGH (ref 0.7–3.1)
Lymphs: 41 %
MCH: 29.1 pg (ref 26.6–33.0)
MCHC: 30.1 g/dL — ABNORMAL LOW (ref 31.5–35.7)
MCV: 97 fL (ref 79–97)
Monocytes Absolute: 0.6 x10E3/uL (ref 0.1–0.9)
Monocytes: 7 %
Neutrophils Absolute: 3.9 x10E3/uL (ref 1.4–7.0)
Neutrophils: 48 %
Platelets: 323 x10E3/uL (ref 150–450)
RBC: 4.88 x10E6/uL (ref 4.14–5.80)
RDW: 11.9 % (ref 11.6–15.4)
WBC: 8.2 x10E3/uL (ref 3.4–10.8)

## 2024-05-09 LAB — PSA: Prostate Specific Ag, Serum: 0.4 ng/mL (ref 0.0–4.0)

## 2024-05-09 LAB — URIC ACID: Uric Acid: 4.8 mg/dL (ref 3.8–8.4)

## 2024-05-11 ENCOUNTER — Ambulatory Visit: Payer: Self-pay | Admitting: Internal Medicine

## 2024-05-11 NOTE — Progress Notes (Signed)
 Patient notified

## 2024-08-30 ENCOUNTER — Ambulatory Visit: Admitting: Internal Medicine
